# Patient Record
Sex: Female | Born: 1995 | Race: White | Hispanic: No | Marital: Single | Smoking: Never smoker
Health system: Southern US, Community
[De-identification: ages and names within clinical notes are randomized; demographics above are authoritative.]

---
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
## 2015-03-12 ENCOUNTER — Emergency Department: Payer: BLUE CROSS/BLUE SHIELD

## 2015-03-12 ENCOUNTER — Emergency Department
Admission: EM | Admit: 2015-03-12 | Discharge: 2015-03-12 | Disposition: A | Discharge status: Home / Self Care | Payer: BLUE CROSS/BLUE SHIELD | Attending: Emergency Medicine | Admitting: Emergency Medicine

## 2015-03-12 DIAGNOSIS — S53014A Anterior dislocation of right radial head, initial encounter: Secondary | ICD-10-CM | POA: Insufficient documentation

## 2015-03-12 DIAGNOSIS — Y9389 Activity, other specified: Secondary | ICD-10-CM | POA: Insufficient documentation

## 2015-03-12 DIAGNOSIS — F10120 Alcohol abuse with intoxication, uncomplicated: Secondary | ICD-10-CM | POA: Insufficient documentation

## 2015-03-12 DIAGNOSIS — F10920 Alcohol use, unspecified with intoxication, uncomplicated: Secondary | ICD-10-CM

## 2015-03-12 DIAGNOSIS — Y998 Other external cause status: Secondary | ICD-10-CM | POA: Insufficient documentation

## 2015-03-12 DIAGNOSIS — W01198A Fall on same level from slipping, tripping and stumbling with subsequent striking against other object, initial encounter: Secondary | ICD-10-CM | POA: Diagnosis not present

## 2015-03-12 DIAGNOSIS — S59901A Unspecified injury of right elbow, initial encounter: Secondary | ICD-10-CM | POA: Diagnosis present

## 2015-03-12 DIAGNOSIS — Y9289 Other specified places as the place of occurrence of the external cause: Secondary | ICD-10-CM | POA: Insufficient documentation

## 2015-03-12 DIAGNOSIS — S53104A Unspecified dislocation of right ulnohumeral joint, initial encounter: Secondary | ICD-10-CM

## 2015-03-12 LAB — BASIC METABOLIC PANEL
Anion gap: 7 (ref 5–15)
BUN: 16 mg/dL (ref 6–20)
CHLORIDE: 112 mmol/L — AB (ref 101–111)
CO2: 21 mmol/L — AB (ref 22–32)
CREATININE: 1.13 mg/dL — AB (ref 0.44–1.00)
Calcium: 8.4 mg/dL — ABNORMAL LOW (ref 8.9–10.3)
GFR calc non Af Amer: >60 mL/min (ref >60)
Glucose, Bld: 108 mg/dL — ABNORMAL HIGH (ref 65–99)
POTASSIUM: 3.3 mmol/L — AB (ref 3.5–5.1)
Sodium: 140 mmol/L (ref 135–145)

## 2015-03-12 LAB — CBC WITH DIFFERENTIAL/PLATELET
BASOS PCT: 1 %
Basophils Absolute: 0.1 10*3/uL (ref 0–0.1)
EOS ABS: 0.1 10*3/uL (ref 0–0.7)
Eosinophils Relative: 1 %
HCT: 37.5 % (ref 35.0–47.0)
HEMOGLOBIN: 12.8 g/dL (ref 12.0–16.0)
LYMPHS ABS: 3.9 10*3/uL — AB (ref 1.0–3.6)
Lymphocytes Relative: 36 %
MCH: 30.9 pg (ref 26.0–34.0)
MCHC: 34.1 g/dL (ref 32.0–36.0)
MCV: 90.6 fL (ref 80.0–100.0)
Monocytes Absolute: 0.8 10*3/uL (ref 0.2–0.9)
Monocytes Relative: 8 %
NEUTROS PCT: 54 %
Neutro Abs: 5.9 10*3/uL (ref 1.4–6.5)
PLATELETS: 195 10*3/uL (ref 150–440)
RBC: 4.14 MIL/uL (ref 3.80–5.20)
RDW: 12.7 % (ref 11.5–14.5)
WBC: 10.7 10*3/uL (ref 3.6–11.0)

## 2015-03-12 LAB — ETHANOL: ALCOHOL ETHYL (B): 288 mg/dL — AB (ref <5)

## 2015-03-12 MED ORDER — MORPHINE SULFATE (PF) 2 MG/ML IV SOLN
2.0000 mg | Freq: Once | INTRAVENOUS | Status: AC
  Administered 2015-03-12: 2 mg via INTRAVENOUS

## 2015-03-12 MED ORDER — SODIUM CHLORIDE 0.9 % IV BOLUS (SEPSIS)
1000.0000 mL | Freq: Once | INTRAVENOUS | Status: AC
  Administered 2015-03-12: 1000 mL via INTRAVENOUS

## 2015-03-12 MED ORDER — IBUPROFEN 600 MG PO TABS
600.0000 mg | ORAL_TABLET | Freq: Once | ORAL | Status: AC
  Administered 2015-03-12: 600 mg via ORAL
  Filled 2015-03-12: qty 1

## 2015-03-12 MED ORDER — MORPHINE SULFATE (PF) 2 MG/ML IV SOLN
INTRAVENOUS | Status: AC
  Administered 2015-03-12: 2 mg via INTRAVENOUS
  Filled 2015-03-12: qty 1

## 2015-03-12 MED ORDER — IBUPROFEN 600 MG PO TABS: 600.0000 mg | ORAL_TABLET | Freq: Three times a day (TID) | ORAL | Status: AC | PRN

## 2015-03-12 MED ORDER — ONDANSETRON HCL 4 MG/2ML IJ SOLN
4.0000 mg | Freq: Once | INTRAMUSCULAR | Status: AC
  Administered 2015-03-12: 4 mg via INTRAVENOUS

## 2015-03-12 MED ORDER — HYDROCODONE-ACETAMINOPHEN 5-325 MG PO TABS: 1.0000 | ORAL_TABLET | Freq: Four times a day (QID) | ORAL | Status: AC | PRN

## 2015-03-12 MED ORDER — HYDROCODONE-ACETAMINOPHEN 5-325 MG PO TABS
1.0000 | ORAL_TABLET | Freq: Once | ORAL | Status: AC
  Administered 2015-03-12: 1 via ORAL
  Filled 2015-03-12: qty 1

## 2015-03-12 MED ORDER — ONDANSETRON HCL 4 MG/2ML IJ SOLN
INTRAMUSCULAR | Status: AC
  Administered 2015-03-12: 4 mg via INTRAVENOUS
  Filled 2015-03-12: qty 2

## 2015-03-12 NOTE — ED Notes (Signed)
Pt fell tonight, pt has rt elbow deformity, pt has abrasions to her bilat lower legs

## 2015-03-12 NOTE — ED Provider Notes (Signed)
[facility] Emergency Department Provider Note  ____________________________________________  Time seen: Approximately 1:10 AM  I have reviewed the triage vital signs and the nursing notes.   HISTORY  Chief Complaint Arm Injury    HPI Holly Berg is a 19 y.o. female who [facility] the ED [facility] with a chief complaint of right elbow pain. Patient was at a party, intoxicated, thinks she tripped over a stump outdoors and fell, striking her right elbow. Patient denies striking head or LOC. Patient is right-hand dominant. Denies fever, chest pain, shortness of breath, neck pain, abdominal pain, nausea, vomiting, diarrhea. Nothing makes the pain better. Movement makes the pain worse.   Past medical history None   There are no active problems to display for this patient.   No past surgical history on file.  No current outpatient prescriptions on file.  Allergies Review of patient's allergies indicates no known allergies.  No family history on file.  Social History Social History  Substance Use Topics  . Smoking status: Not on file  . Smokeless tobacco: Not on file  . Alcohol Use: Not on file  + EtOH  Review of Systems Constitutional: No fever/chills Eyes: No visual changes. ENT: No sore throat. Cardiovascular: Denies chest pain. Respiratory: Denies shortness of breath. Gastrointestinal: No abdominal pain.  No nausea, no vomiting.  No diarrhea.  No constipation. Genitourinary: Negative for dysuria. Musculoskeletal: Positive for right elbow pain and deformity. Negative for back pain. Skin: Negative for rash. Neurological: Negative for headaches, focal weakness or numbness.  10-point ROS otherwise negative.  ____________________________________________   PHYSICAL EXAM:  VITAL SIGNS: ED Triage Vitals  Enc Vitals Group     BP 03/12/15 0104 156/99 mmHg     Pulse Rate 03/12/15 0104 111     Resp 03/12/15 0104 18     Temp  03/12/15 0104 97.5 F (36.4 C)     Temp Source 03/12/15 0104 Oral     SpO2 03/12/15 0104 96 %     Weight 03/12/15 0104 125 lb (56.7 kg)     Height 03/12/15 0104 5\' 6"  (1.676 m)     Head Cir --      Peak Flow --      Pain Score 03/12/15 0105 10     Pain Loc --      Pain Edu? --      Excl. in GC? --     Constitutional: Alert and oriented. Well appearing and in moderate acute distress. Tearful. Intoxicated. Eyes: Conjunctivae are normal. PERRL. EOMI. Head: Atraumatic. Nose: No congestion/rhinnorhea. Mouth/Throat: Mucous membranes are moist.  Oropharynx non-erythematous. Neck: No stridor. No cervical spine tenderness to palpation. No step-offs or deformities. Cardiovascular: Normal rate, regular rhythm. Grossly normal heart sounds.  Good peripheral circulation. Respiratory: Normal respiratory effort.  No retractions. Lungs CTAB. Gastrointestinal: Soft and nontender. No distention. No abdominal bruits. No CVA tenderness. Musculoskeletal: Right elbow with obvious deformity. 2+ radial pulses. Equal hand grips. Brisk, less than 5 second capillary refill. Sensation grossly intact. Neurologic:  Normal speech and language. No gross focal neurologic deficits are appreciated.  Skin:  Skin is warm, dry and intact. No rash noted. Psychiatric: Mood and affect are normal. Speech and behavior are normal.  ____________________________________________   LABS (all labs ordered are listed, but only abnormal results are displayed)  Labs Reviewed  CBC WITH DIFFERENTIAL/PLATELET - Abnormal; Notable for the following:    Lymphs Abs 3.9 (*)    All other components within normal limits  BASIC METABOLIC PANEL - Abnormal; Notable for the following:    Potassium 3.3 (*)    Chloride 112 (*)    CO2 21 (*)    Glucose, Bld 108 (*)    Creatinine, Ser 1.13 (*)    Calcium 8.4 (*)    All other components within normal limits  ETHANOL - Abnormal; Notable for the following:    Alcohol, Ethyl (B) 288 (*)    All  other components within normal limits   ____________________________________________  EKG  None ____________________________________________  RADIOLOGY  Right elbow complete (viewed by me, interpreted per Dr. Manus Gunning): Elbow dislocation with anterior dislocation of the humerus with respect to the radius and ulna.  Post reduction right elbow x-rays (viewed by me, interpreted per Dr. Cherly Hensen): Successful reduction of right elbow dislocation. No fracture seen. ____________________________________________   PROCEDURES  Procedure(s) performed:   Reduction of dislocation Date/Time: 6:20 AM Performed by: Irean Hong Authorized by: Irean Hong Consent: Verbal consent obtained. Risks and benefits: risks, benefits and alternatives were discussed Consent given by: patient Required items: required blood products, implants, devices, and special equipment available Time out: Immediately prior to procedure a "time out" was called to verify the correct patient, procedure, equipment, support staff and site/side marked as required.  Patient sedated: No moderate sedation; patient intoxicated and fairly sedated after IV morphine alone.  Vitals: Vital signs were monitored during reduction. Patient tolerance: Patient tolerated the procedure well with no immediate complications. Joint: Right elbow Reduction technique: Patient lying supine, right elbow reduced easily with traction. Re-examined post-procedure: neurovascularly intact. 2+ radial pulse. Brisk, less than 5 second capillary refill. Equal hand grips.  SPLINT APPLICATION Date/Time: 6:20 AM Authorized by: Irean Hong Consent: Verbal consent obtained. Risks and benefits: risks, benefits and alternatives were discussed Consent given by: patient Splint applied by: orthopedic technician Location details: Right elbow Splint type: Posterior Supplies used: OCL Post-procedure: The splinted body part was neurovascularly unchanged following  the procedure. Patient tolerance: Patient tolerated the procedure well with no immediate complications.    Critical Care performed: No  ____________________________________________   INITIAL IMPRESSION / ASSESSMENT AND PLAN / ED COURSE  Pertinent labs & imaging results that were available during my care of the patient were reviewed by me and considered in my medical decision making (see chart for details).  19 year old female s/p fall with right elbow deformity concerning for dislocation. States she last had something to drink approximately one hour prior to arrival. Will start IV fluid resuscitation, IV analgesia, obtain imaging studies.  ----------------------------------------- 1:55 AM on 03/12/2015 -----------------------------------------  Updated patient's mother Hilda Lias, via telephone, per patient's permission. Plan to reduce elbow, place splint, obtain post reduction x-ray. Will continue IV fluids until patient is more sober for discharge.  ----------------------------------------- 2:05 AM on 03/12/2015 -----------------------------------------  Patient was fairly sedated after 2 mg IV morphine. Elbow was reduced quite easily. Neurovascularly intact after reduction. Patient tolerated procedure well and is currently resting in no acute distress. IV fluids infusing.  ----------------------------------------- 6:20 AM on 03/12/2015 -----------------------------------------  Patient awake and alert. Tolerated PO without difficulty. Ambulating with steady gait. Will discharge home with sling, analgesia and orthopedics follow-up. Return precautions given. Patient verbalizes understanding and agrees with plan of care. ____________________________________________   FINAL CLINICAL IMPRESSION(S) / ED DIAGNOSES  Final diagnoses:  Alcohol intoxication, uncomplicated (HCC)  Elbow dislocation, right, initial encounter      Irean Hong, MD 03/12/15 [id number]

## 2015-03-12 NOTE — ED Notes (Signed)
Dr Dolores FrameSung to the bedside to reduce pts elbow. Pt tolerated procedure well and Dr Dolores FrameSung was able to pop it back into placed without any sedation.  Pts pain was somewhat improved after reductions. Pt is extremely intoxicated at this time and will remain in the ER for monitoring until she is able to ambulate and tolerate fluids.

## 2015-03-12 NOTE — ED Notes (Signed)
Pt here after falling tonight and pt has deformity to right elbow. Pt is very intoxicated. Friends state that pt has been alert since they found her. Pt is very tearful and upset. Pt placed on all monitor and pt in NAD at this time. Dr Dolores FrameSung in to see pt and meds given.

## 2015-03-12 NOTE — Discharge Instructions (Signed)
1. Take pain medicines as needed (Motrin/Norco #15). 2. Keep splint clean and dry. Wear sling as needed for comfort. 3. Return to the ER for worsening symptoms, persistent vomiting, numbness/tingling or other concerns.  Alcohol Intoxication Alcohol intoxication occurs when the amount of alcohol that a person has consumed impairs his or her ability to mentally and physically function. Alcohol directly impairs the normal chemical activity of the brain. Drinking large amounts of alcohol can lead to changes in mental function and behavior, and it can cause many physical effects that can be harmful.  Alcohol intoxication can range in severity from mild to very severe. Various factors can affect the level of intoxication that occurs, such as the person's age, gender, weight, frequency of alcohol consumption, and the presence of other medical conditions (such as diabetes, seizures, or heart conditions). Dangerous levels of alcohol intoxication may occur when people drink large amounts of alcohol in a short period (binge drinking). Alcohol can also be especially dangerous when combined with certain prescription medicines or "recreational" drugs. SIGNS AND SYMPTOMS Some common signs and symptoms of mild alcohol intoxication include:  Loss of coordination.  Changes in mood and behavior.  Impaired judgment.  Slurred speech. As alcohol intoxication progresses to more severe levels, other signs and symptoms will appear. These may include:  Vomiting.  Confusion and impaired memory.  Slowed breathing.  Seizures.  Loss of consciousness. DIAGNOSIS  Your health care provider will take a medical history and perform a physical exam. You will be asked about the amount and type of alcohol you have consumed. Blood tests will be done to measure the concentration of alcohol in your blood. In many places, your blood alcohol level must be lower than 80 mg/dL (1.61%) to legally drive. However, many dangerous  effects of alcohol can occur at much lower levels.  TREATMENT  People with alcohol intoxication often do not require treatment. Most of the effects of alcohol intoxication are temporary, and they go away as the alcohol naturally leaves the body. Your health care provider will monitor your condition until you are stable enough to go home. Fluids are sometimes given through an IV access tube to help prevent dehydration.  HOME CARE INSTRUCTIONS  Do not drive after drinking alcohol.  Stay hydrated. Drink enough water and fluids to keep your urine clear or pale yellow. Avoid caffeine.   Only take over-the-counter or prescription medicines as directed by your health care provider.  SEEK MEDICAL CARE IF:   You have persistent vomiting.   You do not feel better after a few days.  You have frequent alcohol intoxication. Your health care provider can help determine if you should see a substance use treatment counselor. SEEK IMMEDIATE MEDICAL CARE IF:   You become shaky or tremble when you try to stop drinking.   You shake uncontrollably (seizure).   You throw up (vomit) blood. This may be bright red or may look like black coffee grounds.   You have blood in your stool. This may be bright red or may appear as a black, tarry, bad smelling stool.   You become lightheaded or faint.  MAKE SURE YOU:   Understand these instructions.  Will watch your condition.  Will get help right away if you are not doing well or get worse.   This information is not intended to replace advice given to you by your health care provider. Make sure you discuss any questions you have with your health care provider.   Document Released: 02/19/2005  Document Revised: 01/12/2013 Document Reviewed: 10/15/2012 Elsevier Interactive Patient Education 2016 [employer]  Elbow Dislocation Elbow dislocation is the displacement of the bones that form the elbow joint. Three bones come together to form the elbow.  The humerus is the bone in the upper arm. The radius and ulna are the 2 bones in the forearm that form the lower part of the elbow. The elbow is held in place by very strong, fibrous tissues (ligaments) that connect the bones to each other. CAUSES Elbow dislocations are not common. Typically, they occur when a person falls forward with hands and elbows outstretched. The force of the impact is sent to the elbow. Usually, there is a twisting motion in this force. Elbow dislocations also happen during car crashes when passengers reach out to brace themselves during the impact. RISK FACTORS Although dislocation of the elbow can happen to anyone, some people are at greater risk than others. People at increased risk of elbow dislocation include:  People born with greater looseness in their ligaments.  People born with an ulna bone that has a shallow groove for the elbow hinge joint. SYMPTOMS Symptoms of a complete elbow dislocation usually are obvious. They include extreme pain and the appearance of a deformed arm.  Symptoms of a partial dislocation may not be obvious. Your elbow may move somewhat, but you may have pain and swelling. Also, there will likely be bruising on the inside and outside of your elbow where ligaments have been stretched or torn.  DIAGNOSIS  To diagnose elbow dislocation, your caregiver will perform a physical exam. During this exam, your caregiver will check your arm for tenderness, swelling, and deformity. The skin around your arm and the circulation in your arm also will be checked. Your pulse will be checked at your wrist. If your artery is injured during dislocation, your hand will be cool to the touch and may be white or purple in color. Your caregiver also may check your arm and your ability to move your wrist and fingers to see if you had any damage to your nerves during dislocation. An X-ray exam also may be done to determine if there is bone injury. Results of an X-ray exam  can help show the direction of the dislocation. If you have a simple dislocation, there is no major bone injury. If you have a complex dislocation, you may have broken bones (fractures) associated with the ligament injuries. TREATMENT For a simple elbow dislocation, your bones can usually be realigned in a procedure called a reduction. This is a treatment in which your bones are manually moved back into place either with the use of numbing medicine (regional anesthetic) around your elbow or medicine to make you sleep (general anesthetic). Then your elbow is kept immobile with a sling or a splint for 2 to 3 weeks. This is followed with physical therapy to help your joint move again. Complex elbow dislocation may require surgery to restore joint alignment and repair ligaments. After surgery, your elbow may be protected with an external hinge. This device keeps your elbow from dislocating again while motion exercises are done. Additional surgery may be needed to repair any injuries to blood vessels and nerves or bones and ligaments or to relieve pressure from excessive swelling around the muscles. HOME CARE INSTRUCTIONS The following measures can help to reduce pain and hasten the healing process:  Rest your injured joint. Do not move it. Avoid activities similar to the one that caused your injury.  Exercise your  hand and fingers as instructed by your caregiver.  Apply ice to your injured joint for 1 to 2 days after your reduction or as directed by your caregiver. Applying ice helps to reduce inflammation and pain.  Put ice in a plastic bag.  Place a towel between your skin and the bag.  Leave the ice on for 15 to 20 minutes at a time, every couple of hours while you are awake.  Elevate your arm above your heart and move your wrist and fingers as instructed by your caregiver to help limit swelling.  Take over-the-counter or prescription medicines for pain as directed by your caregiver. SEEK  IMMEDIATE MEDICAL CARE IF:  Your splint becomes damaged.  You have an external hinge and it becomes loose or will not move.  You have an external hinge and you develop drainage around the pins.  Your pain becomes worse rather than better.  You lose feeling in your hand or fingers. MAKE SURE YOU:  Understand these instructions.  Will watch your condition.  Will get help right away if you are not doing well or get worse.   This information is not intended to replace advice given to you by your health care provider. Make sure you discuss any questions you have with your health care provider.   Document Released: 05/06/2001 Document Revised: 06/02/2014 Document Reviewed: 12/25/2014 Elsevier Interactive Patient Education 2016 [employer] Inc[employer]  Cast or Splint Care Casts and splints support injured limbs and keep bones from moving while they heal.  HOME CARE  Keep the cast or splint uncovered during the drying period.  A plaster cast can take 24 to 48 hours to dry.  A fiberglass cast will dry in less than 1 hour.  Do not rest the cast on anything harder than a pillow for 24 hours.  Do not put weight on your injured limb. Do not put pressure on the cast. Wait for your doctor's approval.  Keep the cast or splint dry.  Cover the cast or splint with a plastic bag during baths or wet weather.  If you have a cast over your chest and belly (trunk), take sponge baths until the cast is taken off.  If your cast gets wet, dry it with a towel or blow dryer. Use the cool setting on the blow dryer.  Keep your cast or splint clean. Wash a dirty cast with a damp cloth.  Do not put any objects under your cast or splint.  Do not scratch the skin under the cast with an object. If itching is a problem, use a blow dryer on a cool setting over the itchy area.  Do not trim or cut your cast.  Do not take out the padding from inside your cast.  Exercise your joints near the cast as told by your  doctor.  Raise (elevate) your injured limb on 1 or 2 pillows for the first 1 to 3 days. GET HELP IF:  Your cast or splint cracks.  Your cast or splint is too tight or too loose.  You itch badly under the cast.  Your cast gets wet or has a soft spot.  You have a bad smell coming from the cast.  You get an object stuck under the cast.  Your skin around the cast becomes red or sore.  You have new or more pain after the cast is put on. GET HELP RIGHT AWAY IF:  You have fluid leaking through the cast.  You cannot move your  fingers or toes.  Your fingers or toes turn blue or white or are cool, painful, or puffy (swollen).  You have tingling or lose feeling (numbness) around the injured area.  You have bad pain or pressure under the cast.  You have trouble breathing or have shortness of breath.  You have chest pain.   This information is not intended to replace advice given to you by your health care provider. Make sure you discuss any questions you have with your health care provider.   Document Released: 09/11/2010 Document Revised: 01/12/2013 Document Reviewed: 11/18/2012 Elsevier Interactive Patient Education [employer]

## 2015-03-12 NOTE — ED Notes (Signed)
Pt awake, ambulatory with some assistance to the toilet.  She is complaining of 7/10 elbow pain.   Dr Dolores FrameSung aware and  PO meds ordered.  Pt given some water and crackers to trial prior to medication.

## 2015-04-27 DIAGNOSIS — R21 Rash and other nonspecific skin eruption: Secondary | ICD-10-CM | POA: Diagnosis not present

## 2016-03-27 ENCOUNTER — Encounter: Payer: Self-pay | Admitting: Family Medicine

## 2016-03-27 ENCOUNTER — Ambulatory Visit (INDEPENDENT_AMBULATORY_CARE_PROVIDER_SITE_OTHER): Payer: BLUE CROSS/BLUE SHIELD | Admitting: Family Medicine

## 2016-03-27 VITALS — BP 109/65 | HR 66 | Temp 98.5°F | Resp 14

## 2016-03-27 DIAGNOSIS — J039 Acute tonsillitis, unspecified: Secondary | ICD-10-CM | POA: Diagnosis not present

## 2016-03-27 MED ORDER — AMOXICILLIN 500 MG PO CAPS: 500.0000 mg | ORAL_CAPSULE | Freq: Two times a day (BID) | ORAL | 0 refills | Status: AC

## 2016-03-27 MED ORDER — AMOXICILLIN 500 MG PO CAPS: 500.0000 mg | ORAL_CAPSULE | Freq: Two times a day (BID) | ORAL | 0 refills | Status: DC

## 2016-03-27 NOTE — Progress Notes (Signed)
H and presents today with symptoms of sore throat. Patient states that she also has been having some postnasal drip and subjective fever. She states she's had the symptoms the last few days. She denies any fatigue, chest pain, shortness of breath, document fever, headache, abdominal pain area she has had some nausea. She states her last menstrual period was 2 weeks ago. She denies any chance that she could be pregnant. She is not taking any medications today.  ROS: Negative except mentioned above.  GENERAL: NAD HEENT: Moderate pharyngeal erythema, mild bilateral tonsillar enlargement, no exudate, no erythema of TMs, mild cervical LAD RESP: CTA B CARD: RRR ABD: Positive bowel sounds, nontender, no organomegaly appreciated NEURO: CN II-XII grossly intact   A/P: Tonsillitis - will treat with amoxicillin, rest, hydration, Tylenol/ibuprofen when necessary, if symptoms persist or worsen she is to follow up with me. No athletic activity afebrile.

## 2016-04-01 IMAGING — CR DG ELBOW 2V*R*
1 series · 2 of 2 positions shown · non-contrast
Comparison: Right elbow radiographs performed earlier today at [DATE]
a.m.

CLINICAL DATA: Status post reduction of right elbow dislocation.
Initial encounter.

EXAM:
RIGHT ELBOW - 2 VIEW

[Series 1: ap · 0.17mm/px · 2 of 2 slices shown]
[im 1/2]
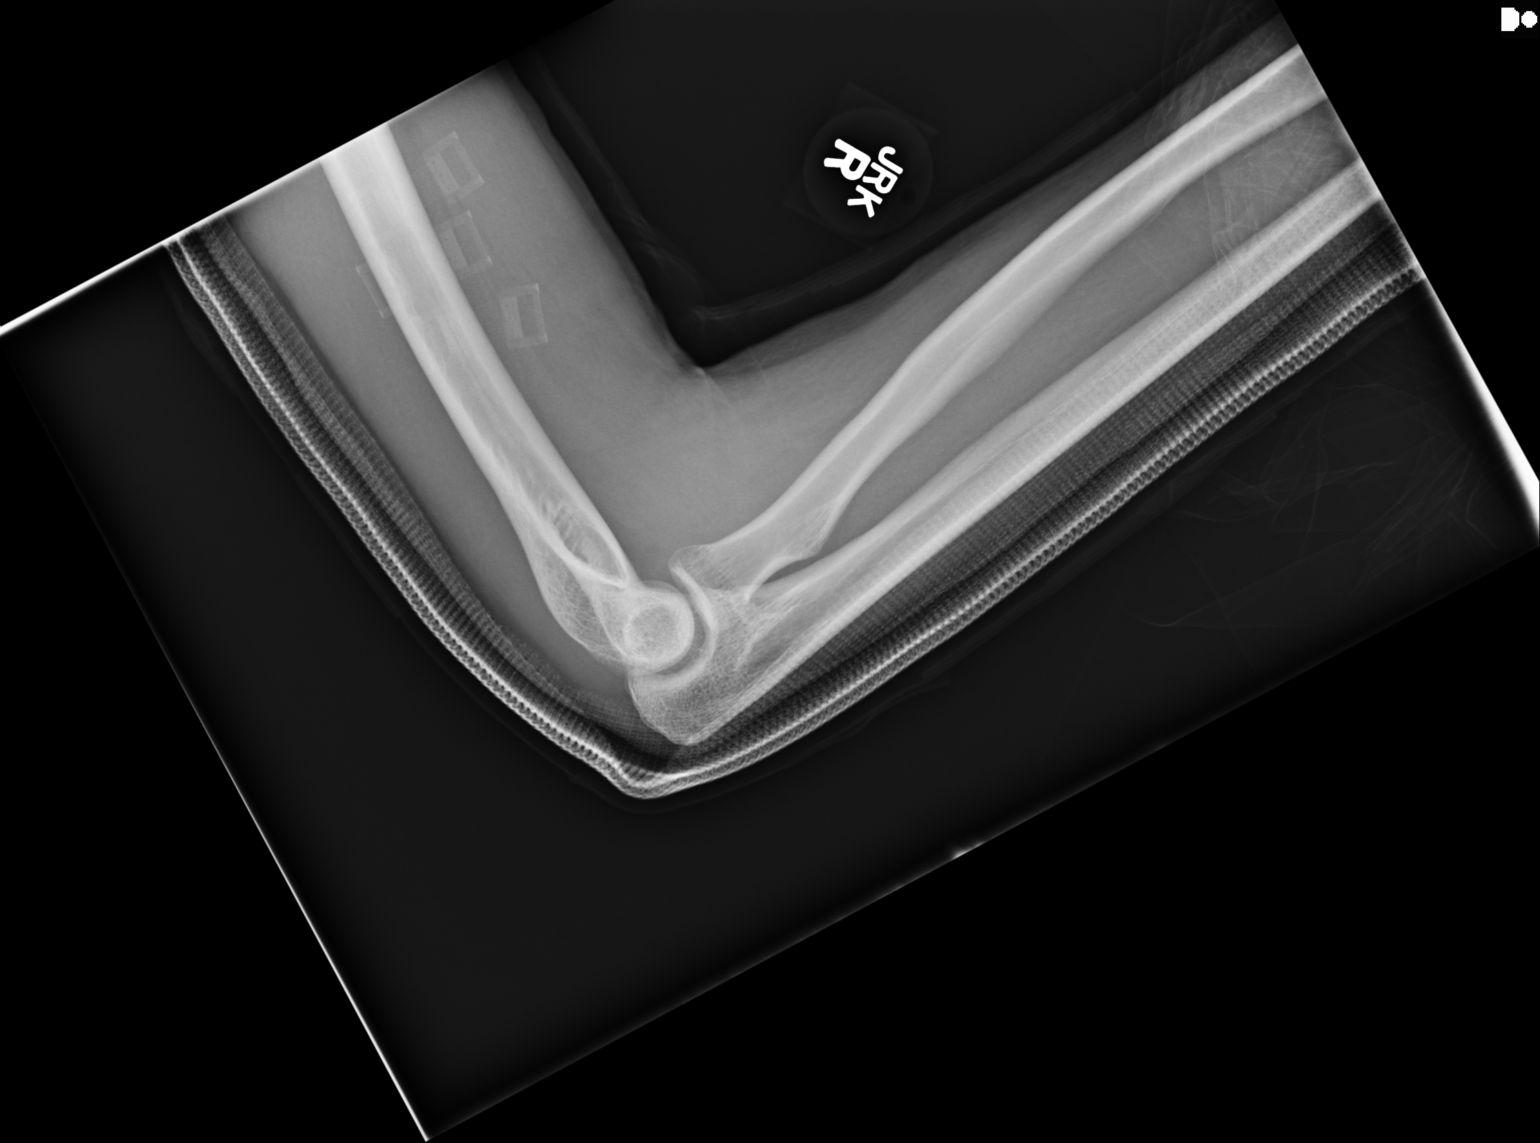
[im 2/2]
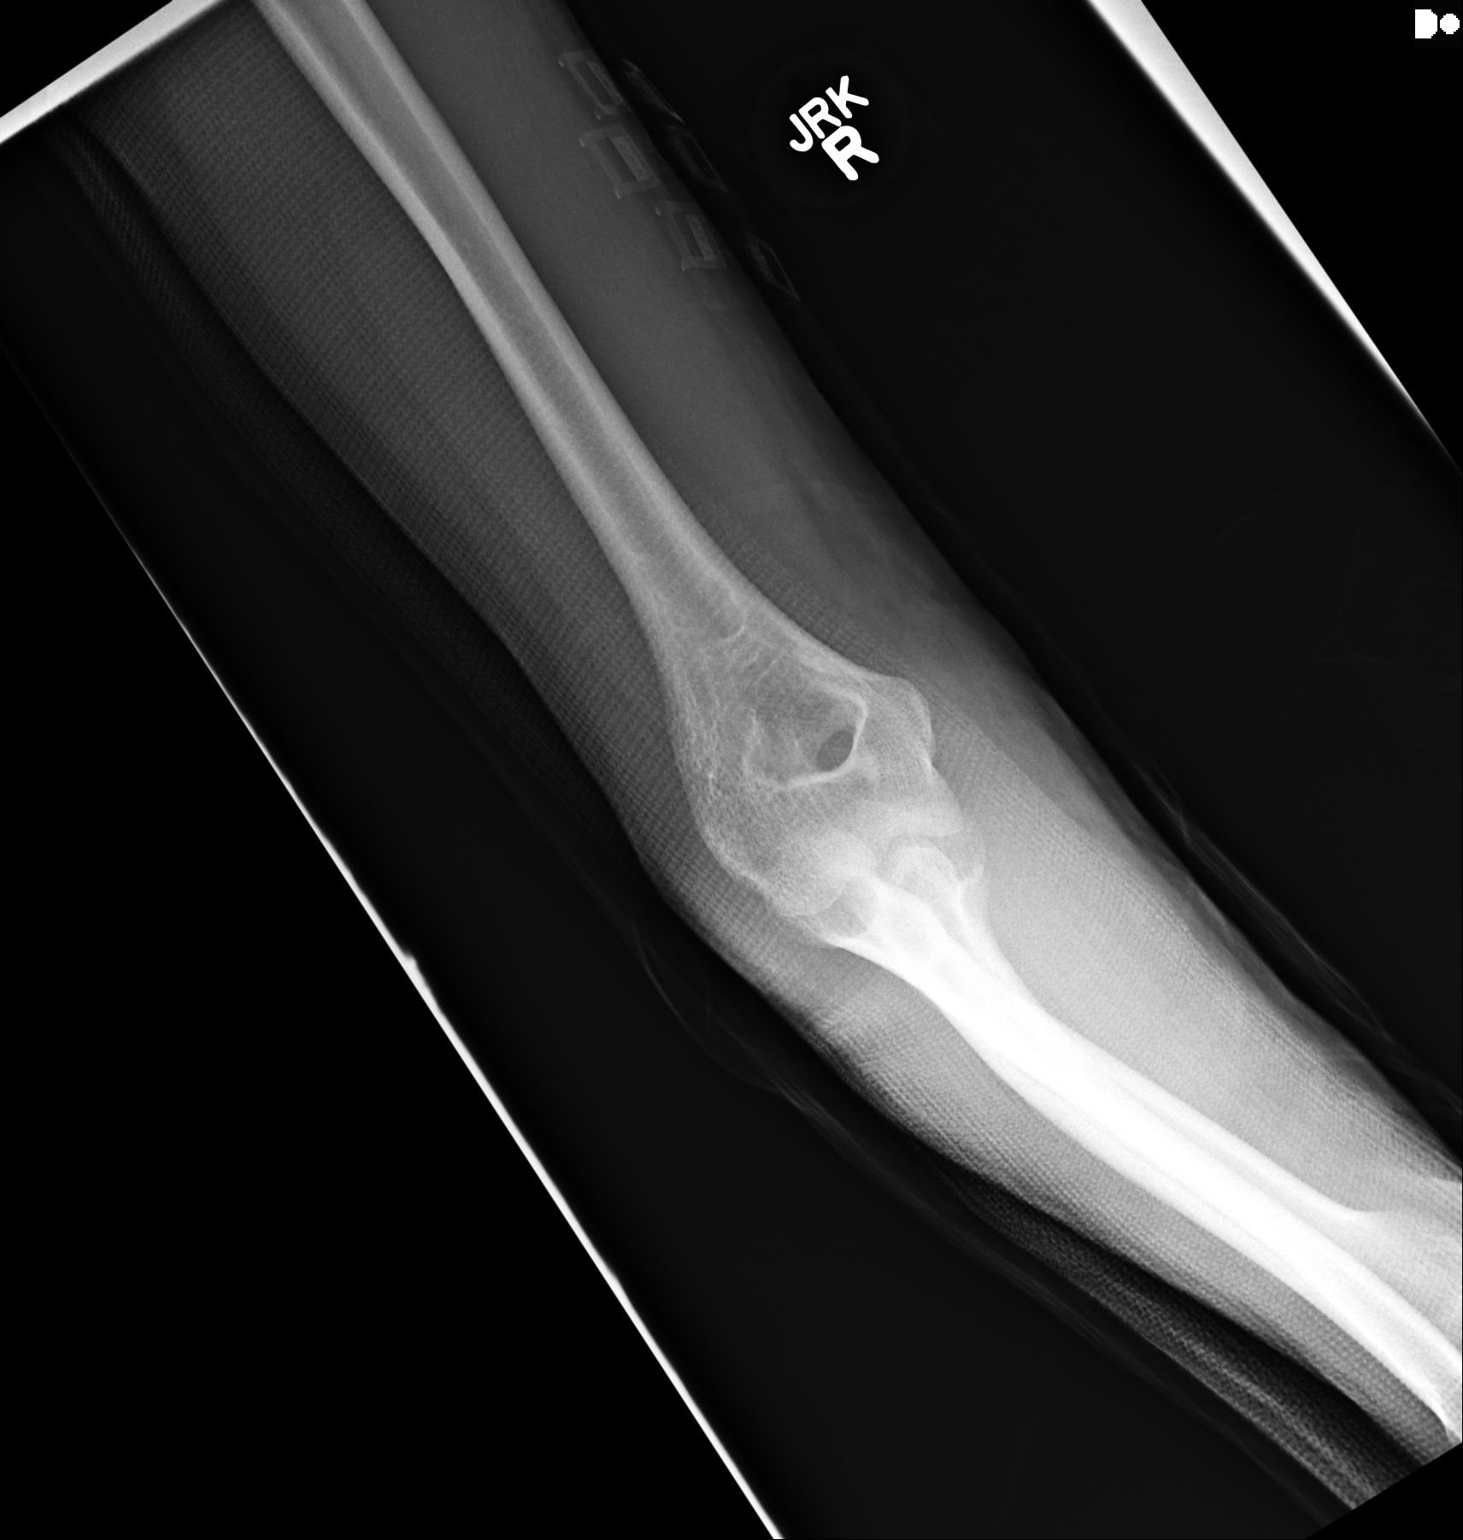

[2 of 2 positions shown; findings below may reference images not displayed]

FINDINGS: There has been successful reduction of the right elbow dislocation.
No elbow joint effusion is seen. No fracture is identified.
Evaluation of the soft tissues is mildly suboptimal due to the
overlying splint.
IMPRESSION: Successful reduction of right elbow dislocation.  No fracture seen.

## 2016-04-01 IMAGING — CR DG ELBOW COMPLETE 3+V*R*
1 series · 4 of 4 positions shown · non-contrast
Comparison: None.

CLINICAL DATA: Right elbow pain and deformity after fall.

EXAM:
RIGHT ELBOW - COMPLETE 3+ VIEW

[Series 1: ap · 0.17mm/px · 4 of 4 slices shown]
[im 1/4]
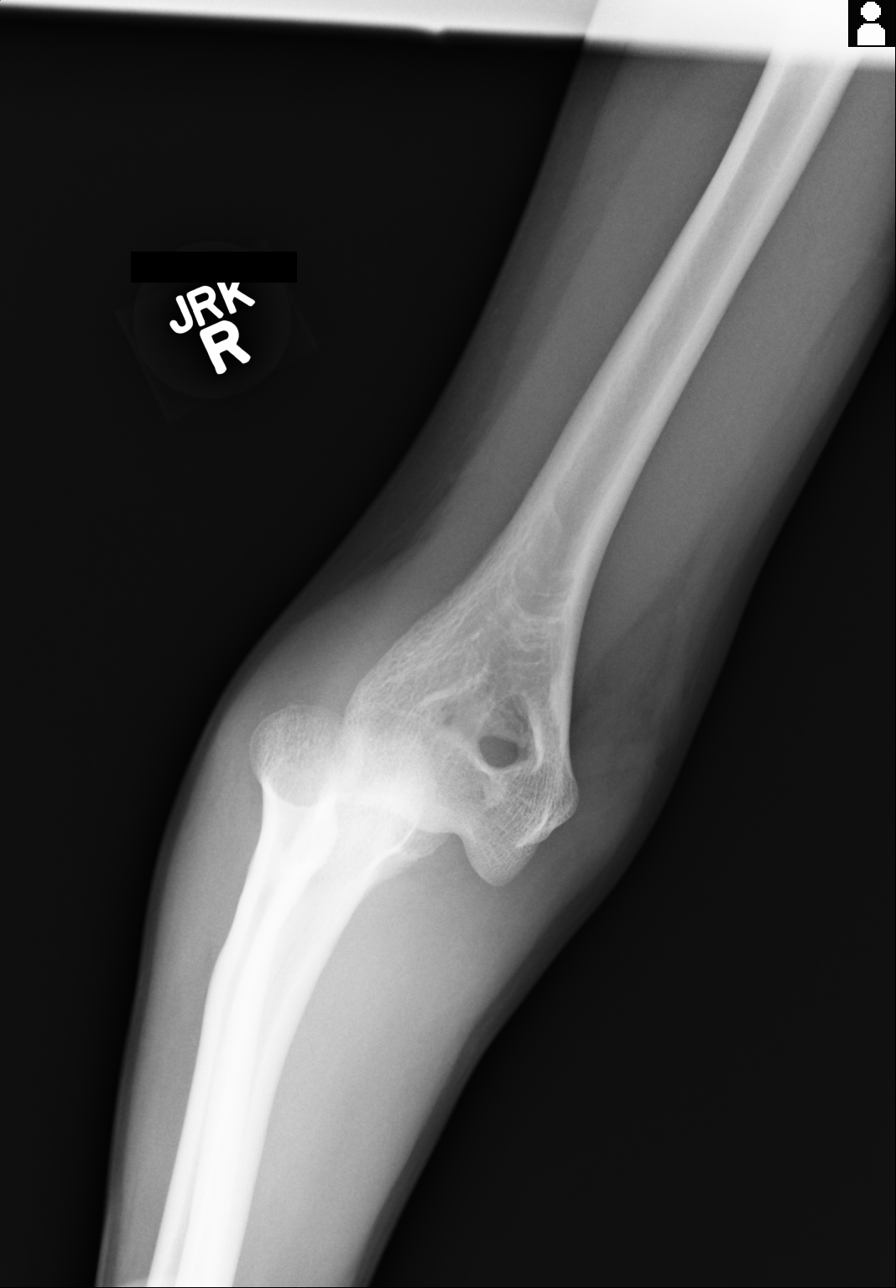
[im 2/4]
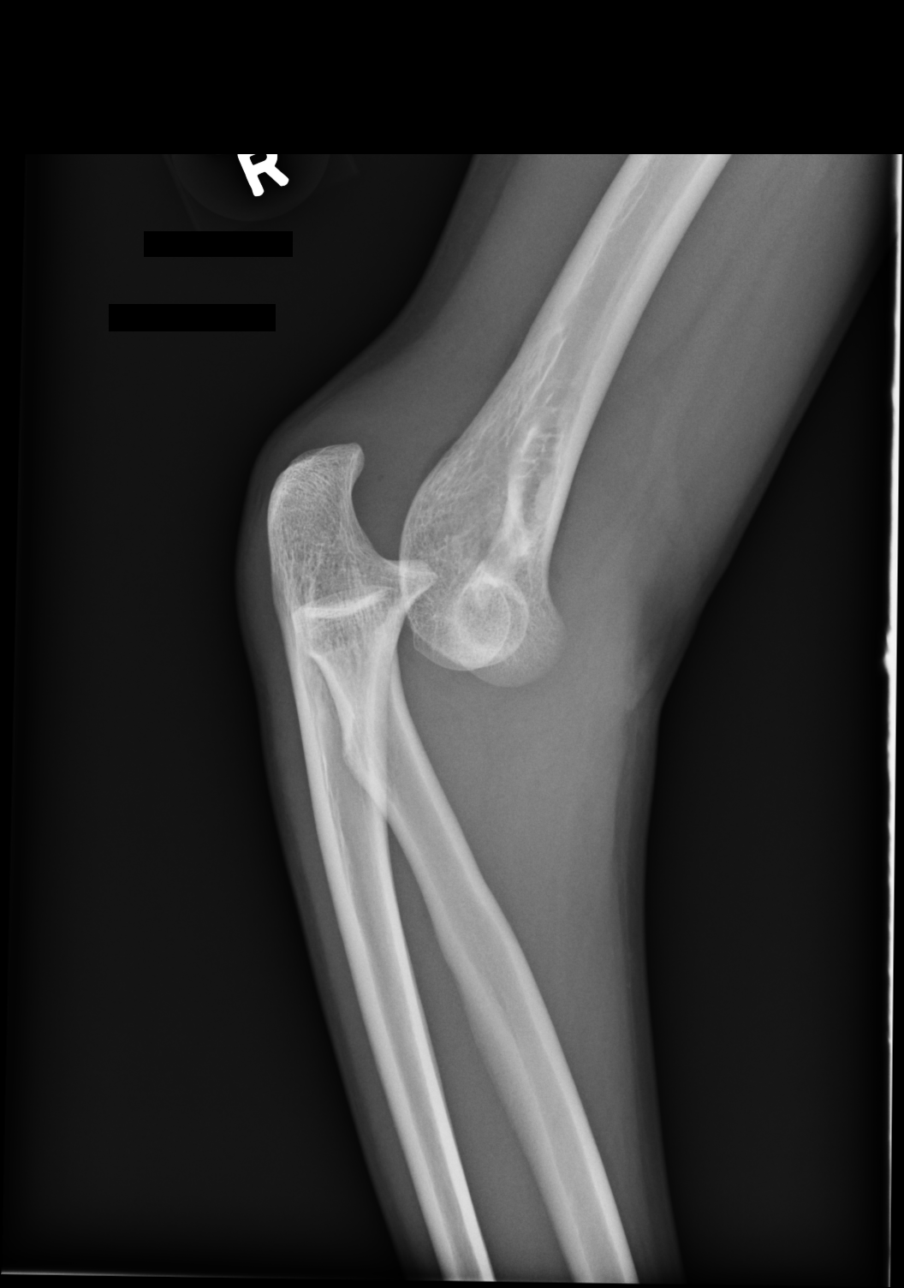
[im 3/4]
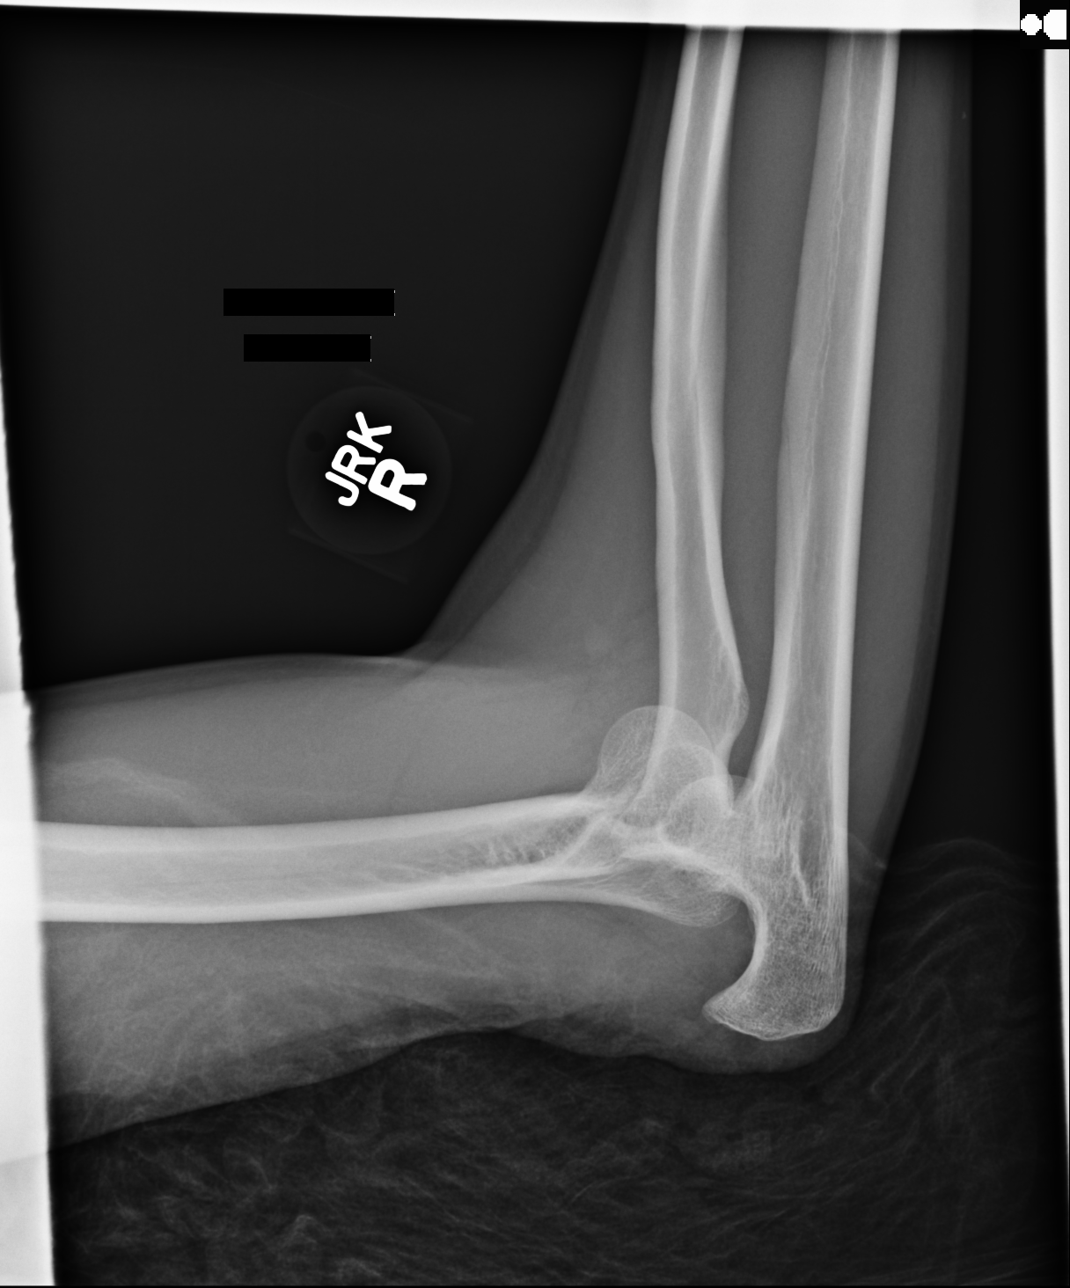
[im 4/4]
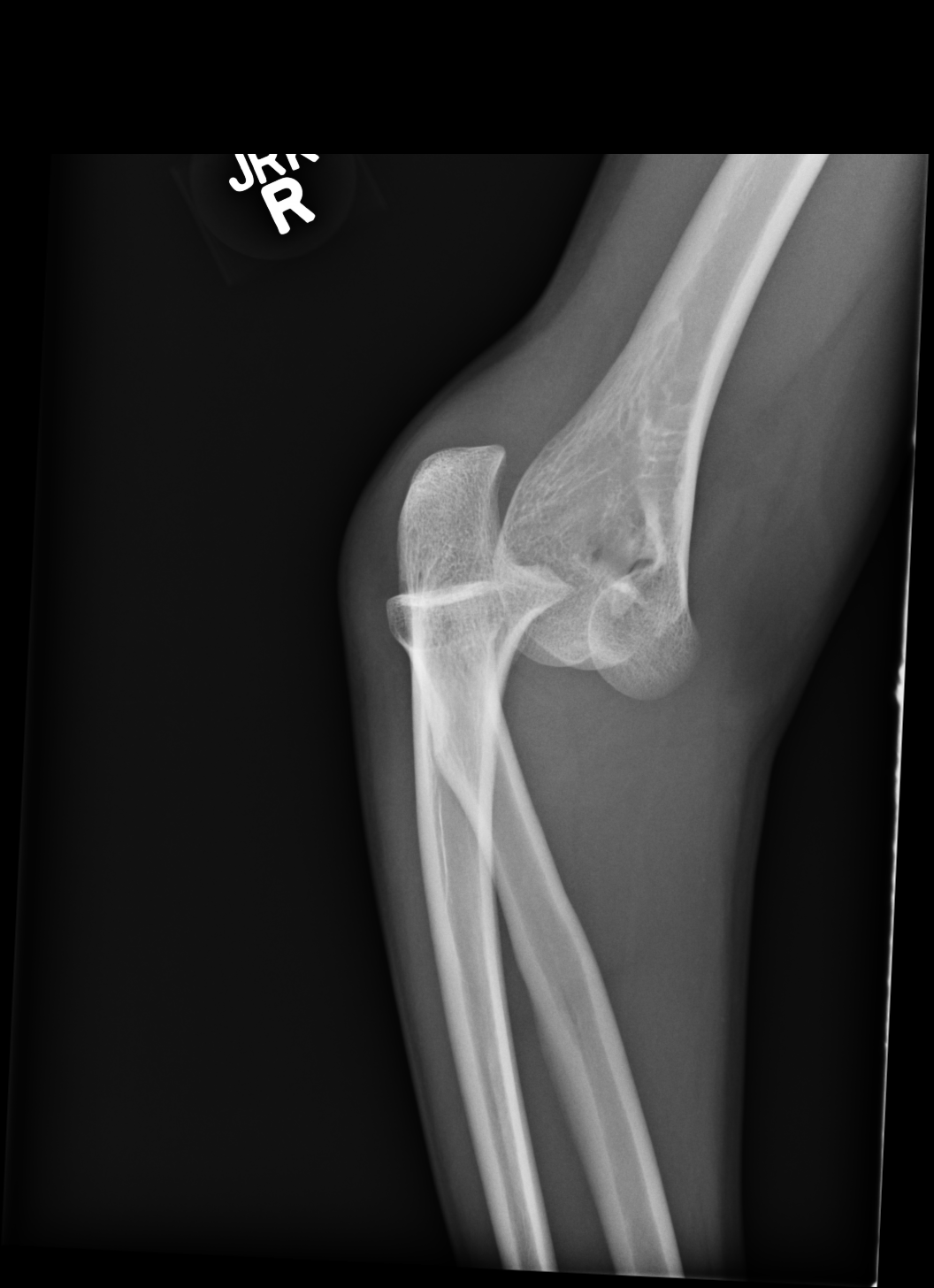

[4 of 4 positions shown; findings below may reference images not displayed]

FINDINGS: There is elbow dislocation with anterior dislocation of the humerus
with respect to the radius and ulna. No definite associated
fracture.
IMPRESSION: Elbow dislocation with anterior dislocation of the humerus with
respect to the radius and ulna.

## 2020-12-04 ENCOUNTER — Other Ambulatory Visit

## 2020-12-04 ENCOUNTER — Encounter (INDEPENDENT_AMBULATORY_CARE_PROVIDER_SITE_OTHER)

## 2020-12-04 ENCOUNTER — Ambulatory Visit: Attending: Infectious Disease

## 2020-12-04 DIAGNOSIS — Z1152 Encounter for screening for COVID-19: Secondary | ICD-10-CM

## 2020-12-04 DIAGNOSIS — Z1159 Encounter for screening for other viral diseases: Secondary | ICD-10-CM

## 2020-12-04 LAB — SARS COV 2 RNA BY PCR: COVID-19 (SARS-CoV-2 PCR): NEGATIVE

## 2020-12-04 NOTE — Patient Instructions (Signed)
Preventing Corona Virus (COVID-19) spread    Self-isolation advice: Preventing the spread to others while you are awaiting your results    You have been advised by your healthcare provider to self-isolate while awaiting results. Please follow the instructions below to reduce the risk of spreading illness to others:    Travel to/from the hospital or healthcare provider's office   Do not use public transportation. Drive yourself if possible. If this is not possible, arrange a ride with a friend, family member, taxi, car service or ambulance. If travelling with another person, you must wear a mask. The healthcare provider's office or hospital should provide you with a mask to use during transportation.      Stay home except to get medical care  Avoid activities outside your home, except for getting medical care. Do not go to work, school, or public areas. Avoid using public transportation, ride-sharing, or taxis.    Separate yourself from other people in your home  As much as possible, stay in a different room from other people in your home. Use a separate bathroom, if available. Do not have visitors into your home.    Call ahead before visiting your doctor   Before your medical appointment, call the healthcare provider and tell them that you have, or are being assessed for COVID-19. This will help the healthcare provider’s office take steps to keep other people from getting infected.    Wear a facemask  Wear a facemask when you are in the same room with other people and when you visit a healthcare provider. If you cannot wear a facemask, the people who live with you should wear one while they are in the same room with you.    Cover your coughs and sneezes  Cover your mouth and nose with a tissue when you cough or sneeze and throw out the tissue immediately after use. If there is no tissue available, you can cough or sneeze into your sleeve/arm. Immediately wash your hands with soap and water or use hand  sanitizer.     Wash your hands   Wash your hands often and thoroughly with soap and water for at least 20 seconds. You can use an alcohol-based hand sanitizer if soap and water are not available and if your hands are not visibly dirty. Avoid touching your eyes, nose, and mouth with unwashed hands.    Avoid sharing household items   Do not share dishes, drinking glasses, cups, eating utensils, towels, bedding, or other items with other people in your home. After using these items, you should wash them thoroughly with soap and water.    Clean all “high-touch” surfaces everyday  High touch surfaces include counters, tabletops, doorknobs, bathroom fixtures, toilets, phones, keyboards, tablets, and bedside tables.    Monitor your symptoms  Seek prompt medical attention if your illness is worsening (e.g., difficulty breathing). Before seeking medical attention, call your healthcare provider or Emergency Department and tell them that you are being assessed for the 2019 Novel Coronavirus infection.    If you have a medical emergency and need to call 911, notify the dispatch personnel that you have, or are being evaluated for COVID-19.     https://www.cdc.gov/coronavirus/2019-ncov/about/steps-when-sick.html   https://www.cdc.gov/coronavirus/2019-ncov/guidance-[email or web address].html
